# Patient Record
Sex: Male | Born: 1960 | Race: White | Hispanic: No | Marital: Married | Smoking: Never smoker
Health system: Southern US, Community
[De-identification: ages and names within clinical notes are randomized; demographics above are authoritative.]

## PROBLEM LIST (undated history)

## (undated) DIAGNOSIS — A071 Giardiasis [lambliasis]: Secondary | ICD-10-CM

## (undated) DIAGNOSIS — F419 Anxiety disorder, unspecified: Secondary | ICD-10-CM

## (undated) DIAGNOSIS — M5136 Other intervertebral disc degeneration, lumbar region: Secondary | ICD-10-CM

## (undated) DIAGNOSIS — F329 Major depressive disorder, single episode, unspecified: Secondary | ICD-10-CM

## (undated) DIAGNOSIS — K219 Gastro-esophageal reflux disease without esophagitis: Secondary | ICD-10-CM

## (undated) DIAGNOSIS — R740 Nonspecific elevation of levels of transaminase and lactic acid dehydrogenase [LDH]: Secondary | ICD-10-CM

## (undated) DIAGNOSIS — M51369 Other intervertebral disc degeneration, lumbar region without mention of lumbar back pain or lower extremity pain: Secondary | ICD-10-CM

## (undated) DIAGNOSIS — E538 Deficiency of other specified B group vitamins: Secondary | ICD-10-CM

## (undated) DIAGNOSIS — I10 Essential (primary) hypertension: Secondary | ICD-10-CM

## (undated) DIAGNOSIS — J309 Allergic rhinitis, unspecified: Secondary | ICD-10-CM

## (undated) DIAGNOSIS — K649 Unspecified hemorrhoids: Secondary | ICD-10-CM

## (undated) DIAGNOSIS — R911 Solitary pulmonary nodule: Secondary | ICD-10-CM

## (undated) DIAGNOSIS — N200 Calculus of kidney: Secondary | ICD-10-CM

## (undated) DIAGNOSIS — R7401 Elevation of levels of liver transaminase levels: Secondary | ICD-10-CM

## (undated) DIAGNOSIS — E119 Type 2 diabetes mellitus without complications: Secondary | ICD-10-CM

## (undated) DIAGNOSIS — F32A Depression, unspecified: Secondary | ICD-10-CM

## (undated) HISTORY — PX: BACK SURGERY: SHX140

## (undated) HISTORY — PX: KIDNEY STONE SURGERY: SHX686

## (undated) HISTORY — PX: COLONOSCOPY: SHX174

## (undated) HISTORY — PX: ESOPHAGOGASTRODUODENOSCOPY: SHX1529

---

## 2007-09-27 ENCOUNTER — Ambulatory Visit: Payer: Self-pay | Admitting: Internal Medicine

## 2007-11-06 ENCOUNTER — Ambulatory Visit: Payer: Self-pay | Admitting: Internal Medicine

## 2008-02-13 ENCOUNTER — Ambulatory Visit: Payer: Self-pay | Admitting: Internal Medicine

## 2008-08-07 ENCOUNTER — Ambulatory Visit: Payer: Self-pay | Admitting: Internal Medicine

## 2008-10-24 ENCOUNTER — Ambulatory Visit: Payer: Self-pay | Admitting: Internal Medicine

## 2009-01-02 ENCOUNTER — Ambulatory Visit: Payer: Self-pay | Admitting: Gastroenterology

## 2009-09-30 ENCOUNTER — Ambulatory Visit: Payer: Self-pay | Admitting: Internal Medicine

## 2010-05-22 ENCOUNTER — Ambulatory Visit: Payer: Self-pay | Admitting: Gastroenterology

## 2011-06-01 ENCOUNTER — Ambulatory Visit: Payer: Self-pay | Admitting: Gastroenterology

## 2011-06-03 ENCOUNTER — Ambulatory Visit: Payer: Self-pay | Admitting: Gastroenterology

## 2012-03-13 ENCOUNTER — Ambulatory Visit: Payer: Self-pay | Admitting: Family Medicine

## 2012-04-03 ENCOUNTER — Other Ambulatory Visit: Payer: Self-pay | Admitting: Neurosurgery

## 2012-04-05 ENCOUNTER — Encounter (HOSPITAL_COMMUNITY): Payer: Self-pay | Admitting: Pharmacy Technician

## 2012-04-07 ENCOUNTER — Encounter (HOSPITAL_COMMUNITY)
Admission: RE | Admit: 2012-04-07 | Discharge: 2012-04-07 | Disposition: A | Discharge status: Home / Self Care | Payer: BC Managed Care – PPO | Source: Ambulatory Visit | Attending: Neurosurgery | Admitting: Neurosurgery

## 2012-04-07 ENCOUNTER — Encounter (HOSPITAL_COMMUNITY)
Admission: RE | Admit: 2012-04-07 | Discharge: 2012-04-07 | Disposition: A | Discharge status: Home / Self Care | Payer: BC Managed Care – PPO | Source: Ambulatory Visit | Attending: Anesthesiology | Admitting: Anesthesiology

## 2012-04-07 ENCOUNTER — Encounter (HOSPITAL_COMMUNITY): Payer: Self-pay

## 2012-04-07 HISTORY — DX: Type 2 diabetes mellitus without complications: E11.9

## 2012-04-07 HISTORY — DX: Essential (primary) hypertension: I10

## 2012-04-07 HISTORY — DX: Calculus of kidney: N20.0

## 2012-04-07 LAB — CBC
MCHC: 36.2 g/dL — ABNORMAL HIGH (ref 30.0–36.0)
RDW: 12.4 % (ref 11.5–15.5)
WBC: 8.3 10*3/uL (ref 4.0–10.5)

## 2012-04-07 LAB — BASIC METABOLIC PANEL
BUN: 12 mg/dL (ref 6–23)
Creatinine, Ser: 0.79 mg/dL (ref 0.50–1.35)
GFR calc Af Amer: >90 mL/min (ref >90)
GFR calc non Af Amer: >90 mL/min (ref >90)
Potassium: 4.2 mEq/L (ref 3.5–5.1)

## 2012-04-07 LAB — SURGICAL PCR SCREEN
MRSA, PCR: NEGATIVE
Staphylococcus aureus: POSITIVE — AB

## 2012-04-07 NOTE — Pre-Procedure Instructions (Signed)
Wesley Stevens  04/07/2012   Your procedure is scheduled on:  Tuesday, January 16th  Report to [facility] at  2:50 PM.  Call this number if you have problems the morning of surgery: [phone]   Remember:   Do not eat food or drink liquids after midnight.    Take these medicines the morning of surgery with A SIP OF WATER: Norvasc, toprol, oxycodone if needed     STOP Ibuprofen today  Do not wear jewelry, make-up or nail polish.  Do not wear lotions, powders, or perfumes. You may wear deodorant.  Do not shave 48 hours prior to surgery. Men may shave face and neck.  Do not bring valuables to the hospital.  Contacts, dentures or bridgework may not be worn into surgery.  Leave suitcase in the car. After surgery it may be brought to your room.  For patients admitted to the hospital, checkout time is 11:00 AM the day of discharge.   Patients discharged the day of surgery will not be allowed to drive home.    Special Instructions: Shower using CHG 2 nights before surgery and the night before surgery.  If you shower the day of surgery use CHG.  Use special wash - you have one bottle of CHG for all showers.  You should use approximately 1/3 of the bottle for each shower.   Please read over the following fact sheets that you were given: Pain Booklet, Coughing and Deep Breathing, MRSA Information and Surgical Site Infection Prevention

## 2012-04-12 MED ORDER — DEXAMETHASONE SODIUM PHOSPHATE 10 MG/ML IJ SOLN: 10.0000 mg | INTRAMUSCULAR | Status: DC

## 2012-04-12 MED ORDER — CEFAZOLIN SODIUM-DEXTROSE 2-3 GM-% IV SOLR
2.0000 g | INTRAVENOUS | Status: AC
  Administered 2012-04-13: 2 g via INTRAVENOUS

## 2012-04-13 ENCOUNTER — Ambulatory Visit (HOSPITAL_COMMUNITY)
Admission: RE | Admit: 2012-04-13 | Discharge: 2012-04-14 | Disposition: A | Discharge status: Home / Self Care | Payer: BC Managed Care – PPO | Source: Ambulatory Visit | Attending: Neurosurgery | Admitting: Neurosurgery

## 2012-04-13 ENCOUNTER — Ambulatory Visit (HOSPITAL_COMMUNITY): Payer: BC Managed Care – PPO

## 2012-04-13 ENCOUNTER — Encounter (HOSPITAL_COMMUNITY)
Admission: RE | Disposition: A | Discharge status: Home / Self Care | Payer: Self-pay | Source: Ambulatory Visit | Attending: Neurosurgery

## 2012-04-13 ENCOUNTER — Ambulatory Visit (HOSPITAL_COMMUNITY): Payer: BC Managed Care – PPO | Admitting: Anesthesiology

## 2012-04-13 ENCOUNTER — Encounter (HOSPITAL_COMMUNITY): Payer: Self-pay | Admitting: Anesthesiology

## 2012-04-13 DIAGNOSIS — I10 Essential (primary) hypertension: Secondary | ICD-10-CM | POA: Insufficient documentation

## 2012-04-13 DIAGNOSIS — Z01810 Encounter for preprocedural cardiovascular examination: Secondary | ICD-10-CM | POA: Insufficient documentation

## 2012-04-13 DIAGNOSIS — Z79899 Other long term (current) drug therapy: Secondary | ICD-10-CM | POA: Insufficient documentation

## 2012-04-13 DIAGNOSIS — Z01812 Encounter for preprocedural laboratory examination: Secondary | ICD-10-CM | POA: Insufficient documentation

## 2012-04-13 DIAGNOSIS — N289 Disorder of kidney and ureter, unspecified: Secondary | ICD-10-CM | POA: Insufficient documentation

## 2012-04-13 DIAGNOSIS — M5126 Other intervertebral disc displacement, lumbar region: Secondary | ICD-10-CM | POA: Insufficient documentation

## 2012-04-13 DIAGNOSIS — Z01818 Encounter for other preprocedural examination: Secondary | ICD-10-CM | POA: Insufficient documentation

## 2012-04-13 DIAGNOSIS — E119 Type 2 diabetes mellitus without complications: Secondary | ICD-10-CM | POA: Insufficient documentation

## 2012-04-13 DIAGNOSIS — M5116 Intervertebral disc disorders with radiculopathy, lumbar region: Secondary | ICD-10-CM

## 2012-04-13 HISTORY — PX: LUMBAR LAMINECTOMY/DECOMPRESSION MICRODISCECTOMY: SHX5026

## 2012-04-13 LAB — GLUCOSE, CAPILLARY
Glucose-Capillary: 211 mg/dL — ABNORMAL HIGH (ref 70–99)
Glucose-Capillary: 523 mg/dL — ABNORMAL HIGH (ref 70–99)

## 2012-04-13 LAB — GLUCOSE, RANDOM: Glucose, Bld: 491 mg/dL — ABNORMAL HIGH (ref 70–99)

## 2012-04-13 SURGERY — LUMBAR LAMINECTOMY/DECOMPRESSION MICRODISCECTOMY 1 LEVEL
Anesthesia: General | Site: Back | Laterality: Right | Wound class: Clean

## 2012-04-13 MED ORDER — CEFAZOLIN SODIUM-DEXTROSE 2-3 GM-% IV SOLR
INTRAVENOUS | Status: AC
  Filled 2012-04-13: qty 50

## 2012-04-13 MED ORDER — OXYCODONE-ACETAMINOPHEN 5-325 MG PO TABS: 1.0000 | ORAL_TABLET | ORAL | Status: DC | PRN

## 2012-04-13 MED ORDER — SODIUM CHLORIDE 0.9 % IJ SOLN: 3.0000 mL | INTRAMUSCULAR | Status: DC | PRN

## 2012-04-13 MED ORDER — 0.9 % SODIUM CHLORIDE (POUR BTL) OPTIME
TOPICAL | Status: DC | PRN
  Administered 2012-04-13: 1000 mL

## 2012-04-13 MED ORDER — SODIUM CHLORIDE 0.9 % IV SOLN
INTRAVENOUS | Status: DC | PRN
  Administered 2012-04-13: 16:00:00 via INTRAVENOUS

## 2012-04-13 MED ORDER — METFORMIN HCL 500 MG PO TABS
1000.0000 mg | ORAL_TABLET | Freq: Two times a day (BID) | ORAL | Status: DC
  Administered 2012-04-14: 1000 mg via ORAL
  Filled 2012-04-13 (×4): qty 2

## 2012-04-13 MED ORDER — SODIUM CHLORIDE 0.9 % IJ SOLN: 3.0000 mL | Freq: Two times a day (BID) | INTRAMUSCULAR | Status: DC

## 2012-04-13 MED ORDER — METOPROLOL SUCCINATE ER 50 MG PO TB24
50.0000 mg | ORAL_TABLET | Freq: Every day | ORAL | Status: DC
  Filled 2012-04-13: qty 1

## 2012-04-13 MED ORDER — MENTHOL 3 MG MT LOZG: 1.0000 | LOZENGE | OROMUCOSAL | Status: DC | PRN

## 2012-04-13 MED ORDER — INSULIN ASPART 100 UNIT/ML ~~LOC~~ SOLN
0.0000 [IU] | SUBCUTANEOUS | Status: DC
  Administered 2012-04-13: 15 [IU] via SUBCUTANEOUS
  Administered 2012-04-14 (×2): 5 [IU] via SUBCUTANEOUS

## 2012-04-13 MED ORDER — GLIMEPIRIDE 4 MG PO TABS
4.0000 mg | ORAL_TABLET | Freq: Every day | ORAL | Status: DC
Start: 2012-04-14 — End: 2012-04-14
  Administered 2012-04-14: 4 mg via ORAL
  Filled 2012-04-13 (×2): qty 1

## 2012-04-13 MED ORDER — KETOROLAC TROMETHAMINE 30 MG/ML IJ SOLN
30.0000 mg | Freq: Four times a day (QID) | INTRAMUSCULAR | Status: DC
  Administered 2012-04-13 – 2012-04-14 (×2): 30 mg via INTRAVENOUS
  Filled 2012-04-13 (×6): qty 1

## 2012-04-13 MED ORDER — KETOROLAC TROMETHAMINE 30 MG/ML IJ SOLN
INTRAMUSCULAR | Status: DC | PRN
  Administered 2012-04-13: 30 mg via INTRAVENOUS

## 2012-04-13 MED ORDER — ONDANSETRON HCL 4 MG/2ML IJ SOLN
INTRAMUSCULAR | Status: DC | PRN
  Administered 2012-04-13: 4 mg via INTRAVENOUS

## 2012-04-13 MED ORDER — METOCLOPRAMIDE HCL 5 MG/ML IJ SOLN: 10.0000 mg | Freq: Once | INTRAMUSCULAR | Status: DC | PRN

## 2012-04-13 MED ORDER — BUPIVACAINE HCL (PF) 0.5 % IJ SOLN
INTRAMUSCULAR | Status: DC | PRN
  Administered 2012-04-13: 20 mL

## 2012-04-13 MED ORDER — ONDANSETRON HCL 4 MG/2ML IJ SOLN: 4.0000 mg | INTRAMUSCULAR | Status: DC | PRN

## 2012-04-13 MED ORDER — FENTANYL CITRATE 0.05 MG/ML IJ SOLN
INTRAMUSCULAR | Status: DC | PRN
  Administered 2012-04-13: 100 ug via INTRAVENOUS
  Administered 2012-04-13 (×5): 50 ug via INTRAVENOUS

## 2012-04-13 MED ORDER — LACTATED RINGERS IV SOLN
INTRAVENOUS | Status: DC | PRN
  Administered 2012-04-13: 15:00:00 via INTRAVENOUS

## 2012-04-13 MED ORDER — ACETAMINOPHEN 650 MG RE SUPP: 650.0000 mg | RECTAL | Status: DC | PRN

## 2012-04-13 MED ORDER — DEXAMETHASONE SODIUM PHOSPHATE 4 MG/ML IJ SOLN
INTRAMUSCULAR | Status: DC | PRN
  Administered 2012-04-13: 10 mg via INTRAVENOUS

## 2012-04-13 MED ORDER — NEOSTIGMINE METHYLSULFATE 1 MG/ML IJ SOLN
INTRAMUSCULAR | Status: DC | PRN
  Administered 2012-04-13: 4 mg via INTRAVENOUS

## 2012-04-13 MED ORDER — HYDROMORPHONE HCL PF 1 MG/ML IJ SOLN: 0.2500 mg | INTRAMUSCULAR | Status: DC | PRN

## 2012-04-13 MED ORDER — POTASSIUM CHLORIDE IN NACL 20-0.45 MEQ/L-% IV SOLN
INTRAVENOUS | Status: DC
  Administered 2012-04-13: 21:00:00 via INTRAVENOUS
  Filled 2012-04-13 (×3): qty 1000

## 2012-04-13 MED ORDER — PROPOFOL 10 MG/ML IV BOLUS
INTRAVENOUS | Status: DC | PRN
  Administered 2012-04-13: 200 mg via INTRAVENOUS

## 2012-04-13 MED ORDER — SODIUM CHLORIDE 0.9 % IV SOLN
INTRAVENOUS | Status: AC
  Filled 2012-04-13: qty 500

## 2012-04-13 MED ORDER — ACETAMINOPHEN 10 MG/ML IV SOLN
INTRAVENOUS | Status: AC
  Filled 2012-04-13: qty 100

## 2012-04-13 MED ORDER — GLYCOPYRROLATE 0.2 MG/ML IJ SOLN
INTRAMUSCULAR | Status: DC | PRN
  Administered 2012-04-13: 0.6 mg via INTRAVENOUS

## 2012-04-13 MED ORDER — HEMOSTATIC AGENTS (NO CHARGE) OPTIME
TOPICAL | Status: DC | PRN
  Administered 2012-04-13: 1 via TOPICAL

## 2012-04-13 MED ORDER — SODIUM CHLORIDE 0.9 % IR SOLN
Status: DC | PRN
  Administered 2012-04-13: 16:00:00

## 2012-04-13 MED ORDER — MIDAZOLAM HCL 5 MG/5ML IJ SOLN
INTRAMUSCULAR | Status: DC | PRN
  Administered 2012-04-13: 2 mg via INTRAVENOUS

## 2012-04-13 MED ORDER — BACITRACIN 50000 UNITS IM SOLR
INTRAMUSCULAR | Status: AC
  Filled 2012-04-13: qty 1

## 2012-04-13 MED ORDER — OXYCODONE HCL 5 MG PO TABS: 5.0000 mg | ORAL_TABLET | Freq: Once | ORAL | Status: DC | PRN

## 2012-04-13 MED ORDER — ROCURONIUM BROMIDE 100 MG/10ML IV SOLN
INTRAVENOUS | Status: DC | PRN
  Administered 2012-04-13: 50 mg via INTRAVENOUS
  Administered 2012-04-13: 10 mg via INTRAVENOUS

## 2012-04-13 MED ORDER — HYDROMORPHONE HCL PF 1 MG/ML IJ SOLN: 1.0000 mg | INTRAMUSCULAR | Status: DC | PRN

## 2012-04-13 MED ORDER — PHENOL 1.4 % MT LIQD: 1.0000 | OROMUCOSAL | Status: DC | PRN

## 2012-04-13 MED ORDER — AMLODIPINE BESYLATE 10 MG PO TABS
10.0000 mg | ORAL_TABLET | Freq: Every day | ORAL | Status: DC
  Filled 2012-04-13: qty 1

## 2012-04-13 MED ORDER — LIDOCAINE HCL (CARDIAC) 20 MG/ML IV SOLN
INTRAVENOUS | Status: DC | PRN
  Administered 2012-04-13: 50 mg via INTRAVENOUS

## 2012-04-13 MED ORDER — HYDROCODONE-ACETAMINOPHEN 5-325 MG PO TABS
1.0000 | ORAL_TABLET | ORAL | Status: DC | PRN
Start: 2012-04-13 — End: 2012-04-14
  Administered 2012-04-13 – 2012-04-14 (×2): 2 via ORAL
  Filled 2012-04-13 (×2): qty 2

## 2012-04-13 MED ORDER — ACETAMINOPHEN 10 MG/ML IV SOLN
INTRAVENOUS | Status: DC | PRN
  Administered 2012-04-13: 1000 mg via INTRAVENOUS

## 2012-04-13 MED ORDER — CEFAZOLIN SODIUM-DEXTROSE 2-3 GM-% IV SOLR
2.0000 g | Freq: Three times a day (TID) | INTRAVENOUS | Status: DC
  Administered 2012-04-13 – 2012-04-14 (×2): 2 g via INTRAVENOUS
  Filled 2012-04-13 (×3): qty 50

## 2012-04-13 MED ORDER — OXYCODONE HCL 5 MG/5ML PO SOLN: 5.0000 mg | Freq: Once | ORAL | Status: DC | PRN

## 2012-04-13 MED ORDER — CYCLOBENZAPRINE HCL 10 MG PO TABS
10.0000 mg | ORAL_TABLET | Freq: Three times a day (TID) | ORAL | Status: DC | PRN
  Administered 2012-04-13: 10 mg via ORAL
  Filled 2012-04-13: qty 1

## 2012-04-13 MED ORDER — ACETAMINOPHEN 325 MG PO TABS: 650.0000 mg | ORAL_TABLET | ORAL | Status: DC | PRN

## 2012-04-13 MED ORDER — THROMBIN 5000 UNITS EX SOLR
CUTANEOUS | Status: DC | PRN
  Administered 2012-04-13 (×2): 5000 [IU] via TOPICAL

## 2012-04-13 SURGICAL SUPPLY — 44 items
BAG DECANTER FOR FLEXI CONT (MISCELLANEOUS) ×2 IMPLANT
BENZOIN TINCTURE PRP APPL 2/3 (GAUZE/BANDAGES/DRESSINGS) ×2 IMPLANT
BLADE SURG ROTATE 9660 (MISCELLANEOUS) IMPLANT
BRUSH SCRUB EZ PLAIN DRY (MISCELLANEOUS) ×4 IMPLANT
BUR CUTTER 7.0 ROUND (BURR) ×2 IMPLANT
BUR MATCHSTICK NEURO 3.0 LAGG (BURR) ×2 IMPLANT
CANISTER SUCTION 2500CC (MISCELLANEOUS) ×2 IMPLANT
CLOTH BEACON ORANGE TIMEOUT ST (SAFETY) ×2 IMPLANT
CONT SPEC 4OZ CLIKSEAL STRL BL (MISCELLANEOUS) ×2 IMPLANT
DERMABOND ADVANCED (GAUZE/BANDAGES/DRESSINGS) ×1
DERMABOND ADVANCED .7 DNX12 (GAUZE/BANDAGES/DRESSINGS) ×1 IMPLANT
DRAPE LAPAROTOMY 100X72X124 (DRAPES) ×2 IMPLANT
DRAPE MICROSCOPE ZEISS OPMI (DRAPES) ×2 IMPLANT
DRAPE SURG 17X23 STRL (DRAPES) ×4 IMPLANT
DRESSING TELFA 8X3 (GAUZE/BANDAGES/DRESSINGS) ×2 IMPLANT
ELECT REM PT RETURN 9FT ADLT (ELECTROSURGICAL) ×2
ELECTRODE REM PT RTRN 9FT ADLT (ELECTROSURGICAL) ×1 IMPLANT
GAUZE SPONGE 4X4 16PLY XRAY LF (GAUZE/BANDAGES/DRESSINGS) IMPLANT
GLOVE BIO SURGEON STRL SZ 6.5 (GLOVE) ×6 IMPLANT
GLOVE BIOGEL PI IND STRL 7.0 (GLOVE) ×1 IMPLANT
GLOVE BIOGEL PI INDICATOR 7.0 (GLOVE) ×1
GLOVE ECLIPSE 7.5 STRL STRAW (GLOVE) ×4 IMPLANT
GOWN BRE IMP SLV AUR LG STRL (GOWN DISPOSABLE) ×4 IMPLANT
GOWN BRE IMP SLV AUR XL STRL (GOWN DISPOSABLE) ×2 IMPLANT
GOWN STRL REIN 2XL LVL4 (GOWN DISPOSABLE) IMPLANT
KIT BASIN OR (CUSTOM PROCEDURE TRAY) ×2 IMPLANT
KIT ROOM TURNOVER OR (KITS) ×2 IMPLANT
NEEDLE HYPO 22GX1.5 SAFETY (NEEDLE) ×2 IMPLANT
NEEDLE SPNL 22GX3.5 QUINCKE BK (NEEDLE) ×4 IMPLANT
NS IRRIG 1000ML POUR BTL (IV SOLUTION) ×2 IMPLANT
PACK LAMINECTOMY NEURO (CUSTOM PROCEDURE TRAY) ×2 IMPLANT
PAD ARMBOARD 7.5X6 YLW CONV (MISCELLANEOUS) ×6 IMPLANT
PATTIES SURGICAL .75X.75 (GAUZE/BANDAGES/DRESSINGS) ×2 IMPLANT
RUBBERBAND STERILE (MISCELLANEOUS) ×4 IMPLANT
SPONGE GAUZE 4X4 12PLY (GAUZE/BANDAGES/DRESSINGS) ×2 IMPLANT
SPONGE SURGIFOAM ABS GEL SZ50 (HEMOSTASIS) ×2 IMPLANT
STRIP CLOSURE SKIN 1/2X4 (GAUZE/BANDAGES/DRESSINGS) ×2 IMPLANT
SUT VIC AB 2-0 OS6 18 (SUTURE) ×6 IMPLANT
SUT VIC AB 3-0 CP2 18 (SUTURE) ×2 IMPLANT
SYR 20ML ECCENTRIC (SYRINGE) ×2 IMPLANT
TAPE CLOTH 4X10 WHT NS (GAUZE/BANDAGES/DRESSINGS) ×2 IMPLANT
TOWEL OR 17X24 6PK STRL BLUE (TOWEL DISPOSABLE) ×2 IMPLANT
TOWEL OR 17X26 10 PK STRL BLUE (TOWEL DISPOSABLE) ×2 IMPLANT
WATER STERILE IRR 1000ML POUR (IV SOLUTION) ×2 IMPLANT

## 2012-04-13 NOTE — Transfer of Care (Signed)
Immediate Anesthesia Transfer of Care Note  Patient: Wesley Stevens  Procedure(s) Performed: Procedure(s) (LRB) with comments: LUMBAR LAMINECTOMY/DECOMPRESSION MICRODISCECTOMY 1 LEVEL (Right) - Right Lumbar Four-Five Microdiskectomy  Patient Location: PACU  Anesthesia Type:General  Level of Consciousness: awake, alert  and sedated  Airway & Oxygen Therapy: Patient Spontanous Breathing and Patient connected to face mask oxygen  Post-op Assessment: Report given to PACU RN  Post vital signs: stable  Complications: No apparent anesthesia complications

## 2012-04-13 NOTE — Anesthesia Postprocedure Evaluation (Signed)
  Anesthesia Post-op Note  Patient: Wesley Stevens  Procedure(s) Performed: Procedure(s) (LRB) with comments: LUMBAR LAMINECTOMY/DECOMPRESSION MICRODISCECTOMY 1 LEVEL (Right) - Right Lumbar Four-Five Microdiskectomy  Patient Location: PACU  Anesthesia Type:General  Level of Consciousness: awake and alert   Airway and Oxygen Therapy: Patient Spontanous Breathing  Post-op Pain: mild  Post-op Assessment: Post-op Vital signs reviewed, Patient's Cardiovascular Status Stable, Respiratory Function Stable, Patent Airway and No signs of Nausea or vomiting  Post-op Vital Signs: Reviewed and stable  Complications: No apparent anesthesia complications

## 2012-04-13 NOTE — Preoperative (Addendum)
Beta Blockers   Reason not to administer Beta Blockers:Not Applicable 

## 2012-04-13 NOTE — Anesthesia Preprocedure Evaluation (Addendum)
Anesthesia Evaluation  Patient identified by MRN, date of birth, ID band Patient awake    Reviewed: Allergy & Precautions, H&P , NPO status , Patient's Chart, lab work & pertinent test results, reviewed documented beta blocker date and time   Airway Mallampati: II TM Distance: >3 FB Neck ROM: full    Dental  (+) Teeth Intact and Dental Advisory Given   Pulmonary neg pulmonary ROS,  breath sounds clear to auscultation        Cardiovascular hypertension, On Medications and On Home Beta Blockers Rhythm:regular Rate:Tachycardia     Neuro/Psych negative neurological ROS  negative psych ROS   GI/Hepatic negative GI ROS, Neg liver ROS,   Endo/Other  diabetes, Poorly Controlled, Oral Hypoglycemic Agents  Renal/GU Renal disease  negative genitourinary   Musculoskeletal negative musculoskeletal ROS (+)   Abdominal (+)  Abdomen: soft. Bowel sounds: normal.  Peds  Hematology negative hematology ROS (+)   Anesthesia Other Findings See surgeon's H&P   Reproductive/Obstetrics negative OB ROS                         Anesthesia Physical Anesthesia Plan  ASA: II  Anesthesia Plan: General   Post-op Pain Management:    Induction: Intravenous  Airway Management Planned: Oral ETT  Additional Equipment:   Intra-op Plan:   Post-operative Plan: Extubation in OR  Informed Consent: I have reviewed the patients History and Physical, chart, labs and discussed the procedure including the risks, benefits and alternatives for the proposed anesthesia with the patient or authorized representative who has indicated his/her understanding and acceptance.   Dental Advisory Given  Plan Discussed with: CRNA and Surgeon  Anesthesia Plan Comments:         Anesthesia Quick Evaluation

## 2012-04-13 NOTE — Op Note (Signed)
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 Localizing x-ray was taken prior to incision to identify the appropriate level. Midline incision was made above the spinous processes of L4 and L5. Using Bovie cutting current the incision was carried on the spinous processes. Subperiosteal dissection was then carried out on the right side of the spinous processes and lamina and self-retaining tract was placed for exposure. X-ray showed approach the appropriate level. Using the high-speed drill the inferior 80% of the L4 lamina the medial one half of the facet joint and the superior edge of the L5 lamina were removed. Residual bone and ligamentum flavum removed in a piecemeal fashion. The microscope was draped brought into the field and used for the remainder of the case. Using microdissection technique the lateral aspect of the thecal sac and L5 nerve were identified. The disc space at L4-5 was identified and we dissected superiorly. We saw the large superior fragment disc wedged beneath the L4 nerve root this was removed in a piecemeal fashion. We then able to visualized and decompressed L4 nerve root. We then incised the disc space and thoroughly cleaned out the disc space to decompress the thecal sac and L5 nerve root. At this time inspection was carried out all directions for any evidence of residual compression and none could be identified. Large amounts of irrigation were carried out and any bleeding controlled with upper coagulation and Gelfoam. The was then closed in multiple layers of Vicryl on the muscle fascia subcutaneous and  subcuticular tissues. Dermabond and Steri-Strips were placed on the skin. A sterile dressing was then applied and the patient was extubated and taken to recovery in stable condition.

## 2012-04-13 NOTE — Plan of Care (Signed)
Problem: Consults Goal: Diagnosis - Spinal Surgery Outcome: Completed/Met Date Met:  04/13/12 Microdiscectomy

## 2012-04-13 NOTE — Anesthesia Procedure Notes (Signed)
Procedure Name: Intubation Date/Time: 04/13/2012 3:45 PM Performed by: Ellin Goodie Pre-anesthesia Checklist: Patient identified, Emergency Drugs available, Suction available, Patient being monitored and Timeout performed Patient Re-evaluated:Patient Re-evaluated prior to inductionOxygen Delivery Method: Circle system utilized Preoxygenation: Pre-oxygenation with 100% oxygen Intubation Type: IV induction Ventilation: Mask ventilation without difficulty Laryngoscope Size: Mac and 4 Grade View: Grade I Tube type: Oral Tube size: 7.5 mm Number of attempts: 1 Airway Equipment and Method: Stylet Placement Confirmation: ETT inserted through vocal cords under direct vision,  positive ETCO2 and breath sounds checked- equal and bilateral Secured at: 23 cm Tube secured with: Tape Dental Injury: Teeth and Oropharynx as per pre-operative assessment

## 2012-04-13 NOTE — H&P (Signed)
  Wesley Stevens is an 52 y.o. male.   Chief Complaint: Right leg pain HPI: The patient is a 52 year old gentleman who presented with back and right lower extremity radiating pain. He was tried on aggressive conservative therapy without improvement. He underwent an MRI scan showed a disc herniation at L4-5 on the right. After failing further conservative therapy the patient requested surgery now comes for a right L4-5 microdiscectomy. I've had a long discussion with him regarding the risks and benefits of surgical intervention. The risks discussed include but are not limited to bleeding infection weakness numbness paralysis spinal fluid leakage coma and death. We have discussed alternative methods of therapy along with the risks and benefits of not intervention. He has had the opportunity to ask numerous questions and appears to understand. With this information in hand he has requested we proceed with surgery.  Past Medical History  Diagnosis Date  . Diabetes mellitus without complication   . Hypertension     does not see a cardiologist  . Kidney stones     Past Surgical History  Procedure Date  . Kidney stone surgery     No family history on file. Social History:  reports that he has never smoked. He does not have any smokeless tobacco history on file. He reports that he drinks alcohol. He reports that he does not use illicit drugs.  Allergies:  Allergies  Allergen Reactions  . Lovastatin Other (See Comments)    Severe muscle cramps.  . Oxycodone Itching and Rash    Medications Prior to Admission  Medication Sig Dispense Refill  . amLODipine (NORVASC) 10 MG tablet Take 10 mg by mouth daily.      Marland Kitchen glimepiride (AMARYL) 4 MG tablet Take 4 mg by mouth daily before breakfast.      . ibuprofen (ADVIL,MOTRIN) 200 MG tablet Take 800 mg by mouth every 8 (eight) hours as needed. Pain.      . metFORMIN (GLUCOPHAGE) 1000 MG tablet Take 1,000 mg by mouth 2 (two) times daily with a meal.        . metoprolol succinate (TOPROL-XL) 50 MG 24 hr tablet Take 50 mg by mouth daily. Take with or immediately following a meal.      . oxyCODONE (ROXICODONE) 15 MG immediate release tablet Take 15 mg by mouth every 4 (four) hours as needed. pain        Results for orders placed during the hospital encounter of 04/13/12 (from the past 48 hour(s))  GLUCOSE, CAPILLARY     Status: Abnormal   Collection Time   04/13/12 12:39 PM      Component Value Range Comment   Glucose-Capillary 211 (*) 70 - 99 mg/dL    No results found.  Review of systems not obtained due to patient factors.  Blood pressure 116/83, pulse 99, temperature 97.8 F (36.6 C), temperature source Oral, resp. rate 20, SpO2 97.00%.  The patient is awake alert and oriented. He has no facial asymmetry. His gait is nonantalgic. Reflexes are 1-2+ and equal. He has decreased strength of dorsiflexion on the right. Assessment/Plan \\Impression  is that of a herniated disc at L4-5 on the right. The plan is for a right L4-5 microdiscectomy.  Wesley Meeker, MD 04/13/2012, 3:20 PM

## 2012-04-14 ENCOUNTER — Encounter (HOSPITAL_COMMUNITY): Payer: Self-pay | Admitting: Neurosurgery

## 2012-04-14 LAB — GLUCOSE, CAPILLARY: Glucose-Capillary: 222 mg/dL — ABNORMAL HIGH (ref 70–99)

## 2012-04-14 NOTE — Progress Notes (Signed)
PT. CBG AT 2149 WAS 514 AND RECHECK ON UNIT AT 2153 WAS 523.  STAT GLUCOSE CHECK WAS ORDERED AND THE RESULTS WERE 491.  DR. [facility] WAS PAGED AND NEW ORDERS WERE RECEIVED TO BEGIN MODERATE S/S INSULIN EVERY 4 HOURS.                   CHRIS Hollyn Stucky RN

## 2012-04-14 NOTE — Discharge Summary (Signed)
Physician Discharge Summary  Patient ID: Wesley Stevens MRN: [id number] DOB/AGE: June 28, 1960 52 y.o.  Admit date: 04/13/2012 Discharge date: 04/14/2012  Admission Diagnoses:  Discharge Diagnoses:  Active Problems:  * No active hospital problems. *    Discharged Condition: good  Hospital Course: Surgery one day, home the next. Did well. Pain markedly improved. Ambulated well. Home POD 1, specific instructions given.  Consults: None  Significant Diagnostic Studies: none  Treatments: surgery: L 45 microdiascectomy  Discharge Exam: Blood pressure 101/65, pulse 93, temperature 97.8 F (36.6 C), temperature source Oral, resp. rate 16, SpO2 95.00%. Incision/Wound:healing well  Disposition: Final discharge disposition not confirmed  Discharge Orders    Future Orders Please Complete By Expires   Diet general      Discharge instructions      Comments:   Mostly bedrest. Get up 9 or 10 times each day and walk for 15-20 minutes each time. Very little sitting the first week. No riding in the car until your first post op appointment. If you had neck surgery...may shower from the chest down. If you had low back surgery....you may shower with a saran wrap covering over the incision. Take your pain medicine as needed...and other medicines that you are instructed to take. Call for an appointment...[phone].   Call MD for:  temperature >100.4      Call MD for:  persistant nausea and vomiting      Call MD for:  severe uncontrolled pain      Call MD for:  redness, tenderness, or signs of infection (pain, swelling, redness, odor or green/yellow discharge around incision site)      Call MD for:  difficulty breathing, headache or visual disturbances      Call MD for:  hives          Medication List     As of 04/14/2012  7:55 AM    TAKE these medications         amLODipine 10 MG tablet   Commonly known as: NORVASC   Take 10 mg by mouth daily.      glimepiride 4 MG tablet   Commonly  known as: AMARYL   Take 4 mg by mouth daily before breakfast.      ibuprofen 200 MG tablet   Commonly known as: ADVIL,MOTRIN   Take 800 mg by mouth every 8 (eight) hours as needed. Pain.      metFORMIN 1000 MG tablet   Commonly known as: GLUCOPHAGE   Take 1,000 mg by mouth 2 (two) times daily with a meal.      metoprolol succinate 50 MG 24 hr tablet   Commonly known as: TOPROL-XL   Take 50 mg by mouth daily. Take with or immediately following a meal.      oxyCODONE 15 MG immediate release tablet   Commonly known as: ROXICODONE   Take 15 mg by mouth every 4 (four) hours as needed. pain         At home rest most of the time. Get up 9 or 10 times each day and take a 15 or 20 minute walk. No riding in the car and to your first postoperative appointment. If you have neck surgery you may shower from the chest down starting on the third postoperative day. If you had back surgery he may start showering on the third postoperative day with saran wrap wrapped around your incisional area 3 times. After the shower remove the saran wrap. Take pain medicine as needed and  other medications as instructed. Call my office for an appointment.  SignedReinaldo Meeker, MD 04/14/2012, 7:55 AM

## 2013-06-28 IMAGING — CR DG CHEST 2V
2 series · 2 of 2 positions shown · non-contrast
Comparison: None.

CLINICAL DATA: Hypertension; preoperative lumbar discectomy

CHEST - 2 VIEW

[view not recorded (1 of 2)]
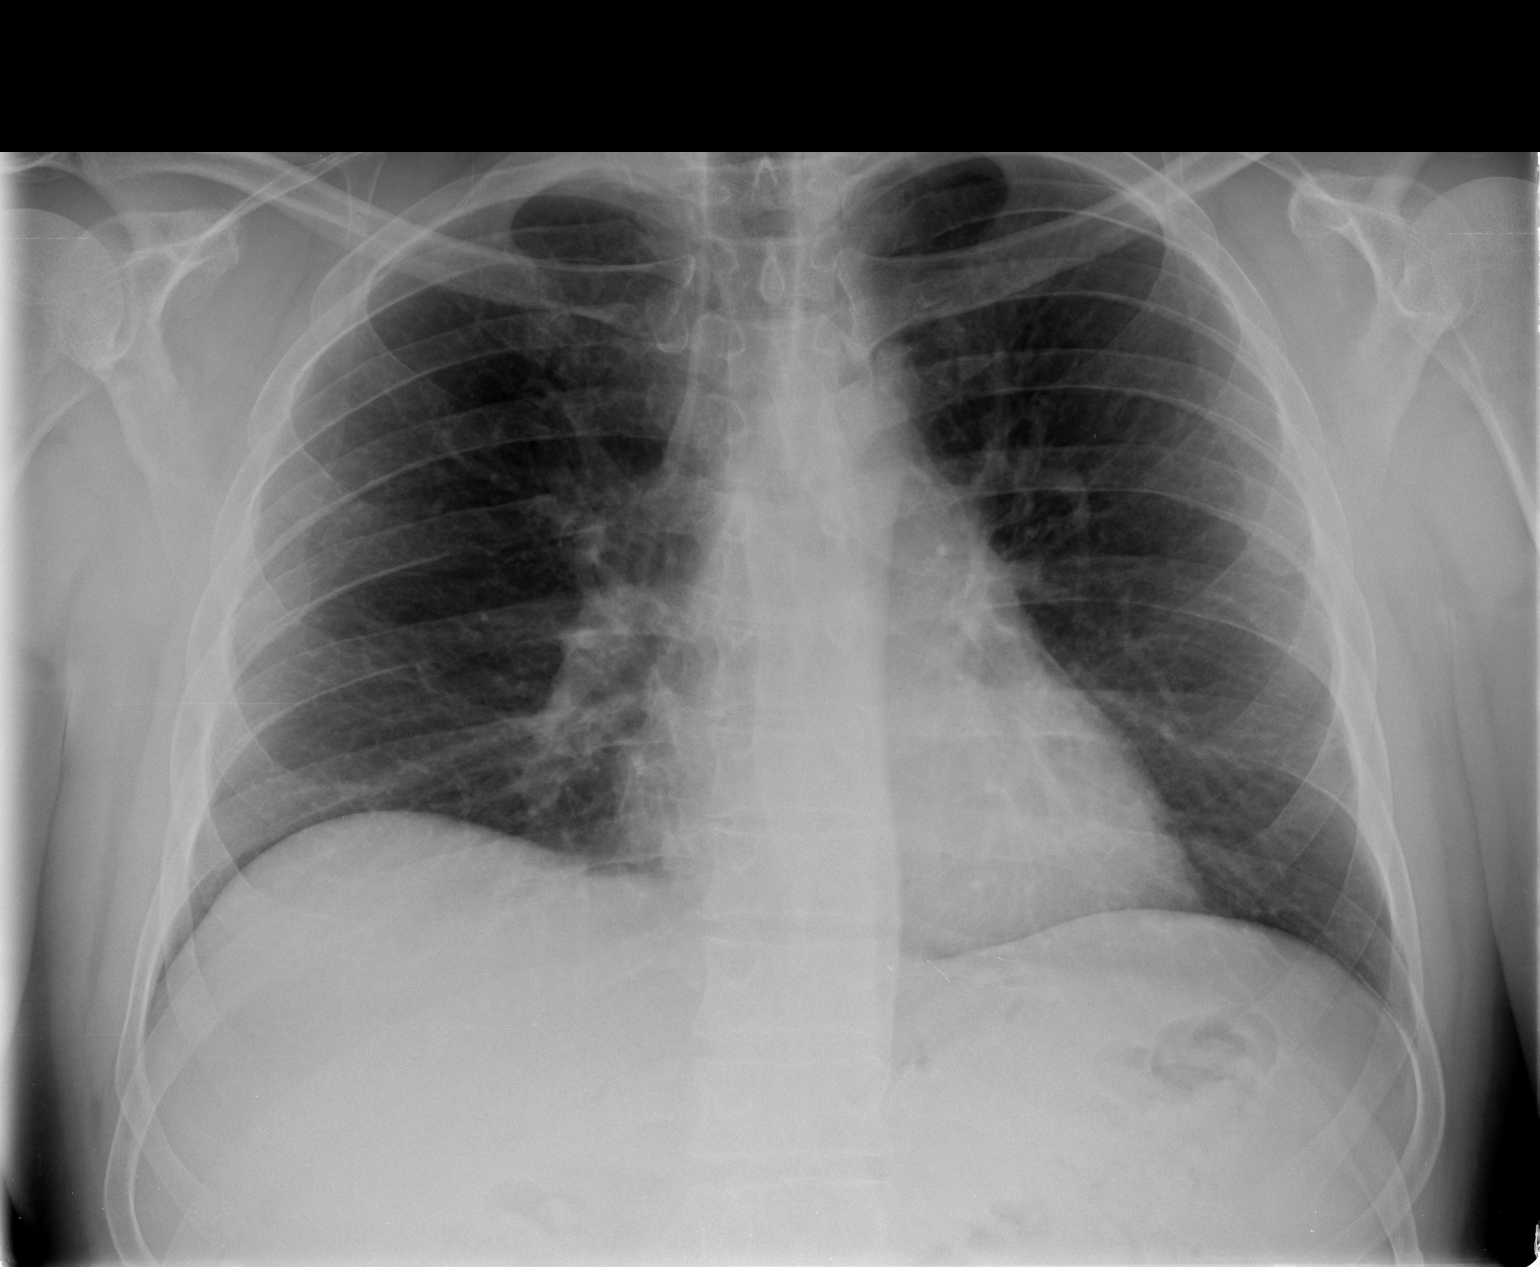

[view not recorded (2 of 2)]
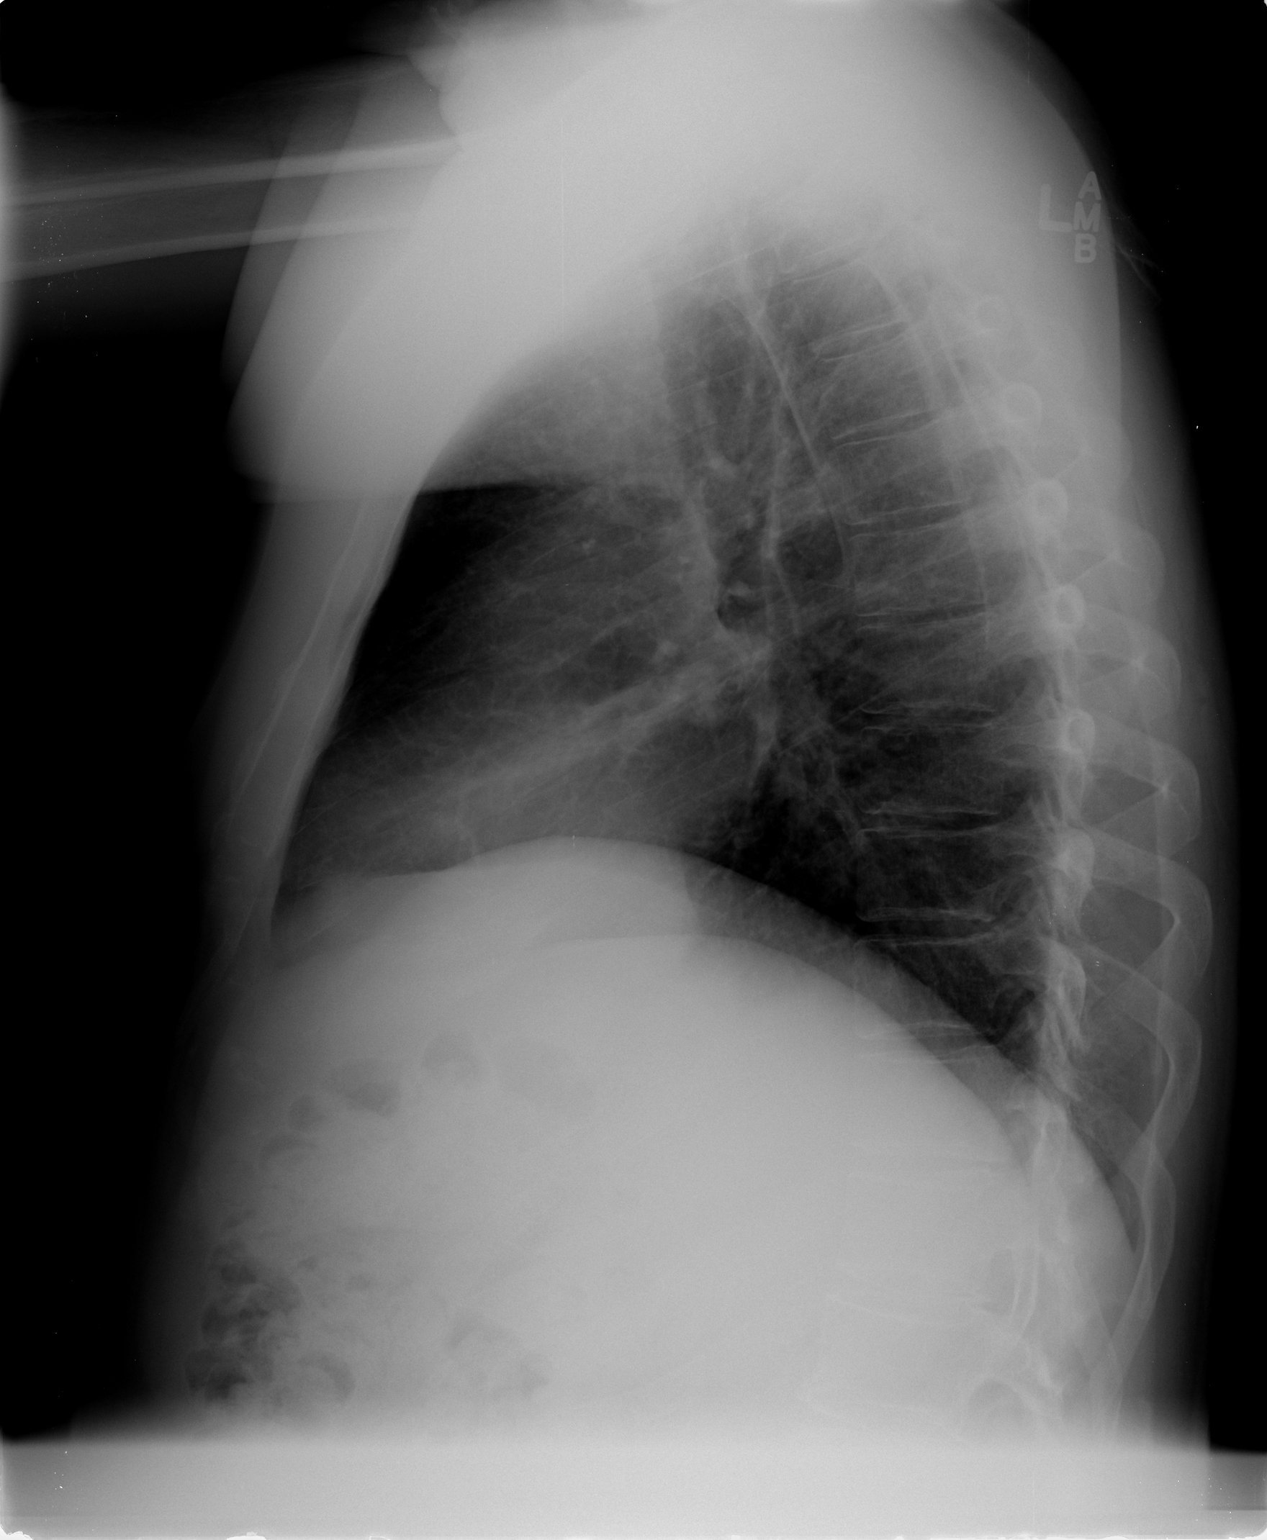

[2 of 2 positions shown; findings below may reference images not displayed]

FINDINGS: Lungs clear.  The heart size and pulmonary vascularity
are normal.  No adenopathy.  No bone lesions.
IMPRESSION: No abnormality noted.

## 2013-10-16 ENCOUNTER — Ambulatory Visit: Payer: Self-pay | Admitting: Internal Medicine

## 2014-11-21 DIAGNOSIS — E1165 Type 2 diabetes mellitus with hyperglycemia: Secondary | ICD-10-CM | POA: Insufficient documentation

## 2014-11-21 DIAGNOSIS — E538 Deficiency of other specified B group vitamins: Secondary | ICD-10-CM | POA: Insufficient documentation

## 2016-02-27 ENCOUNTER — Ambulatory Visit: Payer: Self-pay | Admitting: Dietician

## 2016-05-12 ENCOUNTER — Other Ambulatory Visit: Payer: Self-pay | Admitting: Internal Medicine

## 2016-05-12 DIAGNOSIS — K219 Gastro-esophageal reflux disease without esophagitis: Secondary | ICD-10-CM

## 2016-05-19 ENCOUNTER — Ambulatory Visit
Admission: RE | Admit: 2016-05-19 | Discharge: 2016-05-19 | Disposition: A | Discharge status: Home / Self Care | Payer: BLUE CROSS/BLUE SHIELD | Source: Ambulatory Visit | Attending: Internal Medicine | Admitting: Internal Medicine

## 2016-05-19 DIAGNOSIS — K219 Gastro-esophageal reflux disease without esophagitis: Secondary | ICD-10-CM | POA: Diagnosis not present

## 2016-05-19 DIAGNOSIS — K449 Diaphragmatic hernia without obstruction or gangrene: Secondary | ICD-10-CM | POA: Insufficient documentation

## 2016-06-15 ENCOUNTER — Other Ambulatory Visit: Payer: Self-pay | Admitting: Gastroenterology

## 2016-06-15 DIAGNOSIS — R1013 Epigastric pain: Secondary | ICD-10-CM

## 2016-06-29 ENCOUNTER — Encounter
Admission: RE | Admit: 2016-06-29 | Discharge: 2016-06-29 | Disposition: A | Discharge status: Home / Self Care | Payer: BLUE CROSS/BLUE SHIELD | Source: Ambulatory Visit | Attending: Gastroenterology | Admitting: Gastroenterology

## 2016-06-29 ENCOUNTER — Ambulatory Visit
Admission: RE | Admit: 2016-06-29 | Discharge: 2016-06-29 | Disposition: A | Discharge status: Home / Self Care | Payer: BLUE CROSS/BLUE SHIELD | Source: Ambulatory Visit | Attending: Gastroenterology | Admitting: Gastroenterology

## 2016-06-29 DIAGNOSIS — K7689 Other specified diseases of liver: Secondary | ICD-10-CM

## 2016-06-29 DIAGNOSIS — K824 Cholesterolosis of gallbladder: Secondary | ICD-10-CM | POA: Insufficient documentation

## 2016-06-29 DIAGNOSIS — R1013 Epigastric pain: Secondary | ICD-10-CM

## 2016-06-29 MED ORDER — TECHNETIUM TC 99M MEBROFENIN IV KIT
5.0000 | PACK | Freq: Once | INTRAVENOUS | Status: AC | PRN
  Administered 2016-06-29: 5.41 via INTRAVENOUS

## 2016-08-09 ENCOUNTER — Encounter: Payer: Self-pay | Admitting: *Deleted

## 2016-08-10 ENCOUNTER — Other Ambulatory Visit
Admission: RE | Admit: 2016-08-10 | Discharge: 2016-08-10 | Disposition: A | Discharge status: Home / Self Care | Payer: BLUE CROSS/BLUE SHIELD | Source: Ambulatory Visit | Attending: Gastroenterology | Admitting: Gastroenterology

## 2016-08-10 ENCOUNTER — Ambulatory Visit: Payer: BLUE CROSS/BLUE SHIELD | Admitting: Anesthesiology

## 2016-08-10 ENCOUNTER — Ambulatory Visit
Admission: RE | Admit: 2016-08-10 | Discharge: 2016-08-10 | Disposition: A | Discharge status: Home / Self Care | Payer: BLUE CROSS/BLUE SHIELD | Source: Ambulatory Visit | Attending: Gastroenterology | Admitting: Gastroenterology

## 2016-08-10 ENCOUNTER — Encounter
Admission: RE | Disposition: A | Discharge status: Home / Self Care | Payer: Self-pay | Source: Ambulatory Visit | Attending: Gastroenterology

## 2016-08-10 DIAGNOSIS — E538 Deficiency of other specified B group vitamins: Secondary | ICD-10-CM | POA: Diagnosis not present

## 2016-08-10 DIAGNOSIS — I10 Essential (primary) hypertension: Secondary | ICD-10-CM | POA: Diagnosis not present

## 2016-08-10 DIAGNOSIS — K6389 Other specified diseases of intestine: Secondary | ICD-10-CM | POA: Diagnosis not present

## 2016-08-10 DIAGNOSIS — K319 Disease of stomach and duodenum, unspecified: Secondary | ICD-10-CM | POA: Diagnosis not present

## 2016-08-10 DIAGNOSIS — K449 Diaphragmatic hernia without obstruction or gangrene: Secondary | ICD-10-CM | POA: Insufficient documentation

## 2016-08-10 DIAGNOSIS — K210 Gastro-esophageal reflux disease with esophagitis: Secondary | ICD-10-CM | POA: Diagnosis not present

## 2016-08-10 DIAGNOSIS — Z79899 Other long term (current) drug therapy: Secondary | ICD-10-CM | POA: Diagnosis not present

## 2016-08-10 DIAGNOSIS — Z8619 Personal history of other infectious and parasitic diseases: Secondary | ICD-10-CM | POA: Insufficient documentation

## 2016-08-10 DIAGNOSIS — R112 Nausea with vomiting, unspecified: Secondary | ICD-10-CM | POA: Diagnosis not present

## 2016-08-10 DIAGNOSIS — Z794 Long term (current) use of insulin: Secondary | ICD-10-CM | POA: Insufficient documentation

## 2016-08-10 DIAGNOSIS — E119 Type 2 diabetes mellitus without complications: Secondary | ICD-10-CM | POA: Insufficient documentation

## 2016-08-10 DIAGNOSIS — K2950 Unspecified chronic gastritis without bleeding: Secondary | ICD-10-CM | POA: Insufficient documentation

## 2016-08-10 HISTORY — DX: Nonspecific elevation of levels of transaminase and lactic acid dehydrogenase (ldh): R74.0

## 2016-08-10 HISTORY — DX: Depression, unspecified: F32.A

## 2016-08-10 HISTORY — PX: ESOPHAGOGASTRODUODENOSCOPY (EGD) WITH PROPOFOL: SHX5813

## 2016-08-10 HISTORY — DX: Anxiety disorder, unspecified: F41.9

## 2016-08-10 HISTORY — DX: Major depressive disorder, single episode, unspecified: F32.9

## 2016-08-10 HISTORY — DX: Elevation of levels of liver transaminase levels: R74.01

## 2016-08-10 HISTORY — DX: Solitary pulmonary nodule: R91.1

## 2016-08-10 HISTORY — DX: Deficiency of other specified B group vitamins: E53.8

## 2016-08-10 HISTORY — DX: Giardiasis (lambliasis): A07.1

## 2016-08-10 HISTORY — DX: Allergic rhinitis, unspecified: J30.9

## 2016-08-10 HISTORY — DX: Calculus of kidney: N20.0

## 2016-08-10 HISTORY — DX: Gastro-esophageal reflux disease without esophagitis: K21.9

## 2016-08-10 HISTORY — DX: Unspecified hemorrhoids: K64.9

## 2016-08-10 HISTORY — DX: Other intervertebral disc degeneration, lumbar region: M51.36

## 2016-08-10 HISTORY — DX: Other intervertebral disc degeneration, lumbar region without mention of lumbar back pain or lower extremity pain: M51.369

## 2016-08-10 LAB — GLUCOSE, CAPILLARY: GLUCOSE-CAPILLARY: 123 mg/dL — AB (ref 65–99)

## 2016-08-10 SURGERY — ESOPHAGOGASTRODUODENOSCOPY (EGD) WITH PROPOFOL
Anesthesia: General

## 2016-08-10 MED ORDER — PROPOFOL 10 MG/ML IV BOLUS
INTRAVENOUS | Status: DC | PRN
  Administered 2016-08-10: 30 mg via INTRAVENOUS
  Administered 2016-08-10: 50 mg via INTRAVENOUS
  Administered 2016-08-10: 30 mg via INTRAVENOUS

## 2016-08-10 MED ORDER — MIDAZOLAM HCL 2 MG/2ML IJ SOLN
INTRAMUSCULAR | Status: AC
  Filled 2016-08-10: qty 2

## 2016-08-10 MED ORDER — LIDOCAINE HCL 2 % IJ SOLN
INTRAMUSCULAR | Status: AC
  Filled 2016-08-10: qty 10

## 2016-08-10 MED ORDER — LIDOCAINE HCL (PF) 1 % IJ SOLN
INTRAMUSCULAR | Status: AC
  Administered 2016-08-10: 0.3 mL via INTRADERMAL
  Filled 2016-08-10: qty 2

## 2016-08-10 MED ORDER — FENTANYL CITRATE (PF) 100 MCG/2ML IJ SOLN
INTRAMUSCULAR | Status: AC
  Filled 2016-08-10: qty 2

## 2016-08-10 MED ORDER — FENTANYL CITRATE (PF) 100 MCG/2ML IJ SOLN
INTRAMUSCULAR | Status: DC | PRN
  Administered 2016-08-10: 50 ug via INTRAVENOUS

## 2016-08-10 MED ORDER — PROPOFOL 500 MG/50ML IV EMUL
INTRAVENOUS | Status: DC | PRN
  Administered 2016-08-10: 150 ug/kg/min via INTRAVENOUS

## 2016-08-10 MED ORDER — MIDAZOLAM HCL 2 MG/2ML IJ SOLN
INTRAMUSCULAR | Status: DC | PRN
  Administered 2016-08-10: 2 mg via INTRAVENOUS

## 2016-08-10 MED ORDER — LIDOCAINE HCL (PF) 1 % IJ SOLN
2.0000 mL | Freq: Once | INTRAMUSCULAR | Status: AC
  Administered 2016-08-10: 0.3 mL via INTRADERMAL

## 2016-08-10 MED ORDER — SODIUM CHLORIDE 0.9 % IV SOLN: INTRAVENOUS | Status: DC

## 2016-08-10 MED ORDER — LIDOCAINE HCL (CARDIAC) 20 MG/ML IV SOLN
INTRAVENOUS | Status: DC | PRN
Start: 2016-08-10 — End: 2016-08-10
  Administered 2016-08-10: 40 mg via INTRAVENOUS

## 2016-08-10 MED ORDER — SODIUM CHLORIDE 0.9 % IV SOLN
INTRAVENOUS | Status: DC
  Administered 2016-08-10: 09:00:00 via INTRAVENOUS
  Administered 2016-08-10: 1000 mL via INTRAVENOUS

## 2016-08-10 MED ORDER — PROPOFOL 500 MG/50ML IV EMUL
INTRAVENOUS | Status: AC
  Filled 2016-08-10: qty 50

## 2016-08-10 NOTE — H&P (Signed)
Outpatient short stay form Pre-procedure 08/10/2016 9:28 AM Christena Deem MD  Primary Physician: Dr. Daniel Nones  Reason for visit:  EGD  History of present illness:  Patient is a 56 year old male presenting today as above. He has a history of intermittent relatively frequent nausea and vomiting that may occur about 30 minutes after eating. Is no hematemesis. He takes no NSAIDs although he does take 81 mg aspirin. That's been held for several days. He does have a history of a gallbladder polyp. It is of note that he had a Giardia infection 2013. He is insulin-dependent diabetic as well. He does dexilant daily and has tried it twice a day without decrease of symptoms. He says that his reflux symptoms are well handled by the [facility] independently.    Current Facility-Administered Medications:  .  0.9 %  sodium chloride infusion, , Intravenous, Continuous, Christena Deem, MD, Last Rate: 20 mL/hr at 08/10/16 0818, 1,000 mL at 08/10/16 0818 .  0.9 %  sodium chloride infusion, , Intravenous, Continuous, Christena Deem, MD  Prescriptions Prior to Admission  Medication Sig Dispense Refill Last Dose  . Cyanocobalamin (VITAMIN B 12 PO) Take by mouth.     . dexlansoprazole (DEXILANT) 60 MG capsule Take 60 mg by mouth daily.     . empagliflozin (JARDIANCE) 25 MG TABS tablet Take 25 mg by mouth daily.     . fexofenadine (ALLEGRA) 180 MG tablet Take 180 mg by mouth daily.     Marland Kitchen amLODipine (NORVASC) 10 MG tablet Take 10 mg by mouth daily.   04/13/2012 at Unknown  . glimepiride (AMARYL) 4 MG tablet Take 4 mg by mouth daily before breakfast.   04/12/2012 at Unknown  . ibuprofen (ADVIL,MOTRIN) 200 MG tablet Take 800 mg by mouth every 8 (eight) hours as needed. Pain.   04/12/2012 at Unknown  . metFORMIN (GLUCOPHAGE) 1000 MG tablet Take 1,000 mg by mouth 2 (two) times daily with a meal.   04/12/2012 at Unknown  . metoprolol succinate (TOPROL-XL) 50 MG 24 hr tablet Take 50 mg by mouth daily. Take with or  immediately following a meal.   04/13/2012 at Unknown  . oxyCODONE (ROXICODONE) 15 MG immediate release tablet Take 15 mg by mouth every 4 (four) hours as needed. pain   04/12/2012 at Unknown     Allergies  Allergen Reactions  . Ace Inhibitors   . Hydrocodone   . Lovastatin Other (See Comments)    Severe muscle cramps.  . Oxycodone Itching and Rash     Past Medical History:  Diagnosis Date  . Allergic rhinitis   . Anxiety   . DDD (degenerative disc disease), lumbar   . Depression   . Diabetes mellitus without complication (HCC)   . Elevated transaminase level   . GERD (gastroesophageal reflux disease)   . Giardia   . Hemorrhoids   . Hypertension    does not see a cardiologist  . Kidney stones   . Lung nodule   . Renal stones   . Vitamin B 12 deficiency     Review of systems:      Physical Exam    Heart and lungs: Regular rate and rhythm without rub or gallop, lungs are bilaterally clear.    HEENT: Normocephalic atraumatic eyes are anicteric    Other:     Pertinant exam for procedure: Soft nontender nondistended bowel sounds positive normoactive.    Planned proceedures: EGD and indicated procedures.have discussed the risks benefits and complications of procedures  to include not limited to bleeding, infection, perforation and the risk of sedation and the patient wishes to proceed.    Christena DeemMartin U Skulskie, MD Gastroenterology 08/10/2016  9:28 AM

## 2016-08-10 NOTE — Op Note (Signed)
[facility] Gastroenterology Patient Name: Wesley Stevens Procedure Date: 08/10/2016 9:15 AM MRN: [id number] Account #: [id number] Date of Birth: December 03, 1960 Admit Type: Outpatient Age: 56 Room: [facility] ENDO ROOM 3 Gender: Male Note Status: Finalized Procedure:            Upper GI endoscopy Indications:          Dyspepsia, Nausea with vomiting Providers:            Christena Deem, MD Referring MD:         Daniel Nones, MD (Referring MD) Medicines:            Monitored Anesthesia Care Complications:        No immediate complications. Procedure:            Pre-Anesthesia Assessment:                       - ASA Grade Assessment: III - A patient with severe                        systemic disease.                       After obtaining informed consent, the endoscope was                        passed under direct vision. Throughout the procedure,                        the patient's blood pressure, pulse, and oxygen                        saturations were monitored continuously. The Endoscope                        was introduced through the mouth, and advanced to the                        third part of duodenum. The upper GI endoscopy was                        accomplished without difficulty. The patient tolerated                        the procedure well. Findings:      The Z-line was variable. Biopsies were taken with a cold forceps for       histology.      The exam of the esophagus was otherwise normal.      Patchy mildly erythematous mucosa without bleeding was found in the       gastric antrum.      Patchy minimal inflammation characterized by erythema and friability was       found in the gastric body.      Biopsies were taken with a cold forceps in the gastric body and in the       gastric antrum for histology.      The cardia and gastric fundus were normal on retroflexion.      The examined duodenum was normal. Biopsies were taken with a cold   forceps for histology, rule out giardia.      A small hiatal hernia was found. The Z-line was a  variable distance from       incisors; the hiatal hernia was sliding. Impression:           - Z-line variable. Biopsied.                       - Erythematous mucosa in the antrum.                       - Bile gastritis.                       - Normal examined duodenum. Biopsied.                       - Biopsies were taken with a cold forceps for histology                        in the gastric body and in the gastric antrum. Recommendation:       - Discharge patient to home.                       - Await pathology results.                       - Continue present medications.                       - consider trial of carafate                       - Await pathology results.                       - Return to GI clinic in 3 weeks.                       - stool for O+P times 3                       - Soft diet for 2 days, then advance as tolerated Procedure Code(s):    --- Professional ---                       [phone], Esophagogastroduodenoscopy, flexible, transoral;                        with biopsy, single or multiple Diagnosis Code(s):    --- Professional ---                       K22.8, Other specified diseases of esophagus                       K31.89, Other diseases of stomach and duodenum                       K29.60, Other gastritis without bleeding                       R10.13, Epigastric pain                       R11.2, Nausea with vomiting, unspecified CPT copyright 2016 American [employer] Association. All rights reserved. The codes documented in  this report are preliminary and upon coder review may  be revised to meet current compliance requirements. Christena DeemMartin U Siraj Dermody, MD 08/10/2016 9:58:36 AM This report has been signed electronically. Number of Addenda: 0 Note Initiated On: 08/10/2016 9:15 AM      [facility]

## 2016-08-10 NOTE — Anesthesia Procedure Notes (Signed)
Date/Time: 08/10/2016 9:35 AM Performed by: Henrietta HooverPOPE, Shaqueta Casady Pre-anesthesia Checklist: Patient identified, Emergency Drugs available, Suction available, Patient being monitored and Timeout performed Patient Re-evaluated:Patient Re-evaluated prior to inductionOxygen Delivery Method: Nasal cannula Placement Confirmation: positive ETCO2

## 2016-08-10 NOTE — Anesthesia Postprocedure Evaluation (Signed)
Anesthesia Post Note  Patient: Wesley Stevens  Procedure(s) Performed: Procedure(s) (LRB): ESOPHAGOGASTRODUODENOSCOPY (EGD) WITH PROPOFOL (N/A)  Patient location during evaluation: PACU Anesthesia Type: General Level of consciousness: awake and alert Pain management: pain level controlled Vital Signs Assessment: post-procedure vital signs reviewed and stable Respiratory status: spontaneous breathing, nonlabored ventilation, respiratory function stable and patient connected to nasal cannula oxygen Cardiovascular status: blood pressure returned to baseline and stable Postop Assessment: no signs of nausea or vomiting Anesthetic complications: no     Last Vitals:  Vitals:   08/10/16 0801 08/10/16 1002  BP: 128/86 124/82  Pulse: 79 73  Resp: 17 16  Temp: 36.3 C 36.6 C    Last Pain:  Vitals:   08/10/16 1002  TempSrc: Tympanic                 Yevette EdwardsJames G Jackqulyn Mendel

## 2016-08-10 NOTE — Anesthesia Post-op Follow-up Note (Cosign Needed)
Anesthesia QCDR form completed.        

## 2016-08-10 NOTE — Transfer of Care (Signed)
Immediate Anesthesia Transfer of Care Note  Patient: Wesley Stevens  Procedure(s) Performed: Procedure(s): ESOPHAGOGASTRODUODENOSCOPY (EGD) WITH PROPOFOL (N/A)  Patient Location: PACU  Anesthesia Type:General  Level of Consciousness: awake  Airway & Oxygen Therapy: Patient Spontanous Breathing and Patient connected to nasal cannula oxygen  Post-op Assessment: Report given to RN and Post -op Vital signs reviewed and stable  Post vital signs: Reviewed and stable  Last Vitals:  Vitals:   08/10/16 0801 08/10/16 1002  BP: 128/86 124/82  Pulse: 79 73  Resp: 17 16  Temp: 36.3 C 36.6 C    Last Pain:  Vitals:   08/10/16 1002  TempSrc: Tympanic         Complications: No apparent anesthesia complications

## 2016-08-10 NOTE — Anesthesia Preprocedure Evaluation (Signed)
Anesthesia Evaluation  Patient identified by MRN, date of birth, ID band Patient awake    Reviewed: Allergy & Precautions, H&P , NPO status , Patient's Chart, lab work & pertinent test results, reviewed documented beta blocker date and time   Airway Mallampati: II   Neck ROM: full    Dental  (+) Poor Dentition   Pulmonary neg pulmonary ROS,    Pulmonary exam normal        Cardiovascular hypertension, negative cardio ROS Normal cardiovascular exam Rhythm:regular Rate:Normal     Neuro/Psych PSYCHIATRIC DISORDERS negative neurological ROS  negative psych ROS   GI/Hepatic negative GI ROS, Neg liver ROS, GERD  Medicated,  Endo/Other  negative endocrine ROSdiabetes  Renal/GU Renal diseasenegative Renal ROS  negative genitourinary   Musculoskeletal   Abdominal   Peds  Hematology negative hematology ROS (+)   Anesthesia Other Findings Past Medical History: No date: Allergic rhinitis No date: Anxiety No date: DDD (degenerative disc disease), lumbar No date: Depression No date: Diabetes mellitus without complication (HCC) No date: Elevated transaminase level No date: GERD (gastroesophageal reflux disease) No date: Giardia No date: Hemorrhoids No date: Hypertension     Comment: does not see a cardiologist No date: Kidney stones No date: Lung nodule No date: Renal stones No date: Vitamin B 12 deficiency Past Surgical History: No date: BACK SURGERY No date: COLONOSCOPY No date: ESOPHAGOGASTRODUODENOSCOPY No date: KIDNEY STONE SURGERY 04/13/2012: LUMBAR LAMINECTOMY/DECOMPRESSION MICRODISCECTO*     Comment: Procedure: LUMBAR LAMINECTOMY/DECOMPRESSION               MICRODISCECTOMY 1 LEVEL;  Surgeon: Reinaldo Meekerandy O               Kritzer, MD;  Location: [facility];  Service:              Neurosurgery;  Laterality: Right;  Right Lumbar              Four-Five Microdiskectomy BMI    Body Mass Index:  29.70 kg/m     Reproductive/Obstetrics negative OB ROS                             Anesthesia Physical Anesthesia Plan  ASA: III  Anesthesia Plan: General   Post-op Pain Management:    Induction:   Airway Management Planned:   Additional Equipment:   Intra-op Plan:   Post-operative Plan:   Informed Consent: I have reviewed the patients History and Physical, chart, labs and discussed the procedure including the risks, benefits and alternatives for the proposed anesthesia with the patient or authorized representative who has indicated his/her understanding and acceptance.   Dental Advisory Given  Plan Discussed with: CRNA  Anesthesia Plan Comments:         Anesthesia Quick Evaluation

## 2016-08-11 ENCOUNTER — Encounter: Payer: Self-pay | Admitting: Gastroenterology

## 2016-08-11 LAB — SURGICAL PATHOLOGY

## 2016-08-16 ENCOUNTER — Other Ambulatory Visit
Admission: RE | Admit: 2016-08-16 | Discharge: 2016-08-16 | Disposition: A | Discharge status: Home / Self Care | Payer: BLUE CROSS/BLUE SHIELD | Source: Ambulatory Visit | Attending: Gastroenterology | Admitting: Gastroenterology

## 2016-08-16 DIAGNOSIS — Z8619 Personal history of other infectious and parasitic diseases: Secondary | ICD-10-CM | POA: Insufficient documentation

## 2016-08-20 LAB — GIARDIA, EIA; OVA/PARASITE: Giardia Ag, Stl: NEGATIVE

## 2016-08-20 LAB — O&P RESULT

## 2016-08-26 LAB — GIARDIA, EIA; OVA/PARASITE: GIARDIA AG STL: NEGATIVE

## 2016-08-26 LAB — O&P RESULT

## 2016-08-30 ENCOUNTER — Other Ambulatory Visit
Admission: RE | Admit: 2016-08-30 | Discharge: 2016-08-30 | Disposition: A | Discharge status: Home / Self Care | Payer: BLUE CROSS/BLUE SHIELD | Source: Ambulatory Visit | Attending: Gastroenterology | Admitting: Gastroenterology

## 2016-08-30 DIAGNOSIS — Z8619 Personal history of other infectious and parasitic diseases: Secondary | ICD-10-CM | POA: Insufficient documentation

## 2016-09-08 LAB — O&P RESULT

## 2016-09-08 LAB — GIARDIA, EIA; OVA/PARASITE: Giardia Ag, Stl: NEGATIVE

## 2017-09-19 IMAGING — NM NM HEPATO W/GB/PHARM/[PERSON_NAME]
3 series · 18 of 18 positions shown · non-contrast
Comparison: None

CLINICAL DATA: Dyspepsia, nausea and vomiting after eating, history
diabetes mellitus

EXAM:
NUCLEAR MEDICINE HEPATOBILIARY IMAGING WITH GALLBLADDER EF
TECHNIQUE: Sequential images of the abdomen were obtained [DATE] minutes
following intravenous administration of radiopharmaceutical. After
oral ingestion of Ensure, gallbladder ejection fraction was
determined. At 60 min, normal ejection fraction is greater than 33%.
RADIOPHARMACEUTICALS:  5.41 mCi 6c-NNm  Choletec IV

[Series 1000: gallbladder ef dynamic · 4.80mm/px · 6 of 120 frames shown]
[frame 11/120]
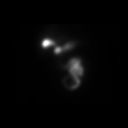
[frame 31/120]
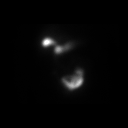
[frame 51/120]
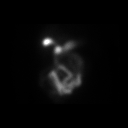
[frame 71/120]
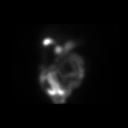
[frame 91/120]
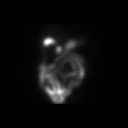
[frame 111/120]
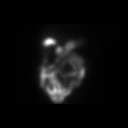

[Series 1000: hepatobiliary dynamic · 9.59mm/px · 6 of 60 frames shown]
[frame 6/60]
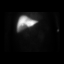
[frame 16/60]
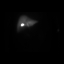
[frame 26/60]
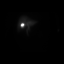
[frame 36/60]
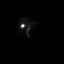
[frame 46/60]
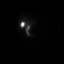
[frame 56/60]
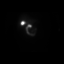

[Series 1000: gallbladder ef dynamic (results) · 4.80mm/px · 6 of 120 frames shown]
[frame 11/120]
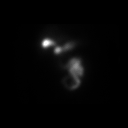
[frame 31/120]
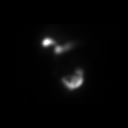
[frame 51/120]
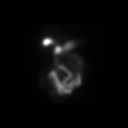
[frame 71/120]
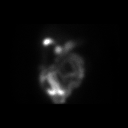
[frame 91/120]
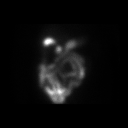
[frame 111/120]
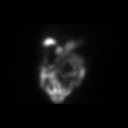

[18 of 18 positions shown; findings below may reference images not displayed]

FINDINGS: Normal tracer extraction from bloodstream indicating normal
hepatocellular function.

Normal excretion of tracer into biliary tree.

Gallbladder visualized at 12 min.

Small bowel visualized at 26 min.

No hepatic retention of tracer.

Subjectively normal emptying of tracer from gallbladder following
fatty meal stimulation.

Calculated gallbladder ejection fraction is 61%, normal.

Patient reported no symptoms following Ensure ingestion.

Normal gallbladder ejection fraction following Ensure ingestion is
greater than 33% at 1 hour.
IMPRESSION: Normal exam.

## 2019-05-08 DIAGNOSIS — G4733 Obstructive sleep apnea (adult) (pediatric): Secondary | ICD-10-CM | POA: Insufficient documentation

## 2019-06-21 DIAGNOSIS — K594 Anal spasm: Secondary | ICD-10-CM | POA: Insufficient documentation

## 2019-06-21 DIAGNOSIS — K642 Third degree hemorrhoids: Secondary | ICD-10-CM | POA: Insufficient documentation

## 2020-05-12 DIAGNOSIS — M791 Myalgia, unspecified site: Secondary | ICD-10-CM | POA: Insufficient documentation

## 2020-05-12 DIAGNOSIS — T466X5A Adverse effect of antihyperlipidemic and antiarteriosclerotic drugs, initial encounter: Secondary | ICD-10-CM | POA: Insufficient documentation

## 2021-01-19 ENCOUNTER — Other Ambulatory Visit: Payer: Self-pay

## 2021-01-19 ENCOUNTER — Other Ambulatory Visit: Payer: Self-pay | Admitting: Podiatry

## 2021-01-19 ENCOUNTER — Encounter: Payer: Self-pay | Admitting: Podiatry

## 2021-01-19 ENCOUNTER — Ambulatory Visit (INDEPENDENT_AMBULATORY_CARE_PROVIDER_SITE_OTHER): Payer: BC Managed Care – PPO

## 2021-01-19 ENCOUNTER — Ambulatory Visit: Payer: BC Managed Care – PPO | Admitting: Podiatry

## 2021-01-19 DIAGNOSIS — R7989 Other specified abnormal findings of blood chemistry: Secondary | ICD-10-CM | POA: Insufficient documentation

## 2021-01-19 DIAGNOSIS — M722 Plantar fascial fibromatosis: Secondary | ICD-10-CM

## 2021-01-19 DIAGNOSIS — I10 Essential (primary) hypertension: Secondary | ICD-10-CM | POA: Insufficient documentation

## 2021-01-19 DIAGNOSIS — M5136 Other intervertebral disc degeneration, lumbar region: Secondary | ICD-10-CM | POA: Insufficient documentation

## 2021-01-19 DIAGNOSIS — M729 Fibroblastic disorder, unspecified: Secondary | ICD-10-CM

## 2021-01-19 DIAGNOSIS — K219 Gastro-esophageal reflux disease without esophagitis: Secondary | ICD-10-CM | POA: Insufficient documentation

## 2021-01-19 MED ORDER — MELOXICAM 15 MG PO TABS: 15.0000 mg | ORAL_TABLET | Freq: Every day | ORAL | 3 refills | Status: AC

## 2021-01-19 MED ORDER — TRIAMCINOLONE ACETONIDE 40 MG/ML IJ SUSP
40.0000 mg | Freq: Once | INTRAMUSCULAR | Status: AC
  Administered 2021-01-19: 40 mg

## 2021-01-19 MED ORDER — METHYLPREDNISOLONE 4 MG PO TBPK: ORAL_TABLET | ORAL | 0 refills | Status: AC

## 2021-01-19 NOTE — Patient Instructions (Signed)

## 2021-01-19 NOTE — Progress Notes (Signed)
Subjective:  Patient ID: Wesley Stevens, male    DOB: 11-30-1960,  MRN: [id number] HPI Chief Complaint  Patient presents with   Foot Pain    Plantar heel/arch/lateral side bilateral (L>R) - aching x 2-3 months, AM pain, wears orthotics, history of PF, tried ice, stretching and Ibuprofen   New Patient (Initial Visit)    60 y.o. male presents with the above complaint.   ROS: Denies fever chills nausea vomit muscle aches pains calf pain back pain chest pain shortness of breath.  Past Medical History:  Diagnosis Date   Allergic rhinitis    Anxiety    DDD (degenerative disc disease), lumbar    Depression    Diabetes mellitus without complication (HCC)    Elevated transaminase level    GERD (gastroesophageal reflux disease)    Giardia    Hemorrhoids    Hypertension    does not see a cardiologist   Kidney stones    Lung nodule    Renal stones    Vitamin B 12 deficiency    Past Surgical History:  Procedure Laterality Date   BACK SURGERY     COLONOSCOPY     ESOPHAGOGASTRODUODENOSCOPY     ESOPHAGOGASTRODUODENOSCOPY (EGD) WITH PROPOFOL N/A 08/10/2016   Procedure: ESOPHAGOGASTRODUODENOSCOPY (EGD) WITH PROPOFOL;  Surgeon: Christena Deem, MD;  Location: [facility];  Service: Endoscopy;  Laterality: N/A;   KIDNEY STONE SURGERY     LUMBAR LAMINECTOMY/DECOMPRESSION MICRODISCECTOMY  04/13/2012   Procedure: LUMBAR LAMINECTOMY/DECOMPRESSION MICRODISCECTOMY 1 LEVEL;  Surgeon: Reinaldo Meeker, MD;  Location: [facility];  Service: Neurosurgery;  Laterality: Right;  Right Lumbar Four-Five Microdiskectomy    Current Outpatient Medications:    meloxicam (MOBIC) 15 MG tablet, Take 1 tablet (15 mg total) by mouth daily., Disp: 30 tablet, Rfl: 3   methylPREDNISolone (MEDROL DOSEPAK) 4 MG TBPK tablet, 6 day dose pack - take as directed, Disp: 21 tablet, Rfl: 0   amLODipine (NORVASC) 10 MG tablet, Take 10 mg by mouth daily., Disp: , Rfl:    CONTOUR NEXT TEST test strip, 1 each 2 (two)  times daily., Disp: , Rfl:    Cyanocobalamin (VITAMIN B 12 PO), Take by mouth., Disp: , Rfl:    empagliflozin (JARDIANCE) 25 MG TABS tablet, Take 25 mg by mouth daily., Disp: , Rfl:    fexofenadine (ALLEGRA) 180 MG tablet, Take 180 mg by mouth daily., Disp: , Rfl:    glimepiride (AMARYL) 4 MG tablet, Take 4 mg by mouth daily before breakfast., Disp: , Rfl:    ibuprofen (ADVIL,MOTRIN) 200 MG tablet, Take 800 mg by mouth every 8 (eight) hours as needed. Pain., Disp: , Rfl:    metFORMIN (GLUCOPHAGE) 1000 MG tablet, Take 1,000 mg by mouth 2 (two) times daily with a meal., Disp: , Rfl:    metoprolol succinate (TOPROL-XL) 50 MG 24 hr tablet, Take 50 mg by mouth daily. Take with or immediately following a meal., Disp: , Rfl:    oxyCODONE (ROXICODONE) 15 MG immediate release tablet, Take 15 mg by mouth every 4 (four) hours as needed. pain, Disp: , Rfl:    pantoprazole (PROTONIX) 40 MG tablet, Take 40 mg by mouth daily., Disp: , Rfl:    phentermine (ADIPEX-P) 37.5 MG tablet, Take 37.5 mg by mouth daily., Disp: , Rfl:    RELION PEN NEEDLE 31G/8MM 31G X 8 MM MISC, USE 1 ONCE DAILY, Disp: , Rfl:    tadalafil (CIALIS) 5 MG tablet, SMARTSIG:2-4 Tablet(s) By Mouth Daily PRN, Disp: , Rfl:  testosterone cypionate (DEPOTESTOSTERONE CYPIONATE) 200 MG/ML injection, Inject 200 mg into the muscle every 14 (fourteen) days., Disp: , Rfl:    TRESIBA FLEXTOUCH 100 UNIT/ML FlexTouch Pen, SMARTSIG:42 Unit(s) SUB-Q Daily, Disp: , Rfl:   Allergies  Allergen Reactions   Ace Inhibitors    Hydrocodone    Lovastatin Other (See Comments)    Severe muscle cramps.   Oxycodone Itching and Rash   Review of Systems Objective:  There were no vitals filed for this visit.  General: Well developed, nourished, in no acute distress, alert and oriented x3   Dermatological: Skin is warm, dry and supple bilateral. Nails x 10 are well maintained; remaining integument appears unremarkable at this time. There are no open sores, no  preulcerative lesions, no rash or signs of infection present.  Vascular: Dorsalis Pedis artery and Posterior Tibial artery pedal pulses are 2/4 bilateral with immedate capillary fill time. Pedal hair growth present. No varicosities and no lower extremity edema present bilateral.   Neruologic: Grossly intact via light touch bilateral. Vibratory intact via tuning fork bilateral. Protective threshold with Semmes Wienstein monofilament intact to all pedal sites bilateral. Patellar and Achilles deep tendon reflexes 2+ bilateral. No Babinski or clonus noted bilateral.   Musculoskeletal: No gross boney pedal deformities bilateral. No pain, crepitus, or limitation noted with foot and ankle range of motion bilateral. Muscular strength 5/5 in all groups tested bilateral.  Pain on palpation MucoClear tubercles bilateral  Gait: Unassisted, Nonantalgic.    Radiographs:  Radiographs taken today demonstrate an osseously mature individual soft tissue increase in density plantar fascial kidney insertion site posterior plantar calcaneal heel spur thickening of the plantar fascia at the calcaneal insertion site.  Assessment & Plan:   Assessment: Planter fasciitis bilateral  Plan: Discussed etiology pathology conservative surgical therapies.  Injected bilateral heels today start him on a Medrol Dosepak to be followed by meloxicam.  Placed in plantar fascial braces discussed appropriate shoe gear stretching exercise ice therapy sugar modifications.  He will watch his blood sugar carefully and adjust his units accordingly.    Tephanie Escorcia T. [place], [place]

## 2021-03-02 ENCOUNTER — Ambulatory Visit: Payer: BC Managed Care – PPO | Admitting: Podiatry

## 2021-04-08 ENCOUNTER — Ambulatory Visit: Payer: BC Managed Care – PPO | Admitting: Podiatry

## 2022-05-27 ENCOUNTER — Other Ambulatory Visit: Payer: Self-pay | Admitting: Internal Medicine

## 2022-05-27 DIAGNOSIS — E785 Hyperlipidemia, unspecified: Secondary | ICD-10-CM

## 2022-05-27 DIAGNOSIS — Z0000 Encounter for general adult medical examination without abnormal findings: Secondary | ICD-10-CM

## 2022-05-27 DIAGNOSIS — R6889 Other general symptoms and signs: Secondary | ICD-10-CM

## 2022-05-27 DIAGNOSIS — E1165 Type 2 diabetes mellitus with hyperglycemia: Secondary | ICD-10-CM

## 2022-06-04 ENCOUNTER — Ambulatory Visit
Admission: RE | Admit: 2022-06-04 | Discharge: 2022-06-04 | Disposition: A | Discharge status: Home / Self Care | Payer: Self-pay | Source: Ambulatory Visit | Attending: Internal Medicine | Admitting: Internal Medicine

## 2022-06-04 DIAGNOSIS — Z0000 Encounter for general adult medical examination without abnormal findings: Secondary | ICD-10-CM | POA: Insufficient documentation

## 2022-06-04 DIAGNOSIS — R6889 Other general symptoms and signs: Secondary | ICD-10-CM | POA: Insufficient documentation

## 2022-06-04 DIAGNOSIS — E1165 Type 2 diabetes mellitus with hyperglycemia: Secondary | ICD-10-CM | POA: Insufficient documentation

## 2022-06-04 DIAGNOSIS — E785 Hyperlipidemia, unspecified: Secondary | ICD-10-CM | POA: Insufficient documentation

## 2023-08-30 DIAGNOSIS — E291 Testicular hypofunction: Secondary | ICD-10-CM | POA: Diagnosis not present

## 2023-08-30 DIAGNOSIS — R7989 Other specified abnormal findings of blood chemistry: Secondary | ICD-10-CM | POA: Diagnosis not present

## 2023-08-31 DIAGNOSIS — E291 Testicular hypofunction: Secondary | ICD-10-CM | POA: Diagnosis not present

## 2023-08-31 DIAGNOSIS — R7989 Other specified abnormal findings of blood chemistry: Secondary | ICD-10-CM | POA: Diagnosis not present

## 2023-09-13 DIAGNOSIS — R7989 Other specified abnormal findings of blood chemistry: Secondary | ICD-10-CM | POA: Diagnosis not present

## 2023-10-03 DIAGNOSIS — E538 Deficiency of other specified B group vitamins: Secondary | ICD-10-CM | POA: Diagnosis not present

## 2023-10-03 DIAGNOSIS — E349 Endocrine disorder, unspecified: Secondary | ICD-10-CM | POA: Diagnosis not present

## 2023-10-05 DIAGNOSIS — G4733 Obstructive sleep apnea (adult) (pediatric): Secondary | ICD-10-CM | POA: Diagnosis not present

## 2023-10-12 DIAGNOSIS — R7989 Other specified abnormal findings of blood chemistry: Secondary | ICD-10-CM | POA: Diagnosis not present

## 2023-10-20 DIAGNOSIS — E349 Endocrine disorder, unspecified: Secondary | ICD-10-CM | POA: Diagnosis not present

## 2023-10-21 DIAGNOSIS — M1712 Unilateral primary osteoarthritis, left knee: Secondary | ICD-10-CM | POA: Diagnosis not present

## 2023-11-03 DIAGNOSIS — E538 Deficiency of other specified B group vitamins: Secondary | ICD-10-CM | POA: Diagnosis not present

## 2023-11-09 DIAGNOSIS — R197 Diarrhea, unspecified: Secondary | ICD-10-CM | POA: Diagnosis not present

## 2023-11-09 DIAGNOSIS — K219 Gastro-esophageal reflux disease without esophagitis: Secondary | ICD-10-CM | POA: Diagnosis not present

## 2023-11-09 DIAGNOSIS — E538 Deficiency of other specified B group vitamins: Secondary | ICD-10-CM | POA: Diagnosis not present

## 2023-11-09 DIAGNOSIS — R1084 Generalized abdominal pain: Secondary | ICD-10-CM | POA: Diagnosis not present

## 2023-11-09 DIAGNOSIS — E1165 Type 2 diabetes mellitus with hyperglycemia: Secondary | ICD-10-CM | POA: Diagnosis not present

## 2023-11-11 DIAGNOSIS — R197 Diarrhea, unspecified: Secondary | ICD-10-CM | POA: Diagnosis not present

## 2023-11-17 DIAGNOSIS — E349 Endocrine disorder, unspecified: Secondary | ICD-10-CM | POA: Diagnosis not present

## 2023-11-17 DIAGNOSIS — R7989 Other specified abnormal findings of blood chemistry: Secondary | ICD-10-CM | POA: Diagnosis not present

## 2023-12-06 DIAGNOSIS — E349 Endocrine disorder, unspecified: Secondary | ICD-10-CM | POA: Diagnosis not present

## 2023-12-06 DIAGNOSIS — E538 Deficiency of other specified B group vitamins: Secondary | ICD-10-CM | POA: Diagnosis not present

## 2023-12-15 DIAGNOSIS — E785 Hyperlipidemia, unspecified: Secondary | ICD-10-CM | POA: Diagnosis not present

## 2023-12-15 DIAGNOSIS — E1165 Type 2 diabetes mellitus with hyperglycemia: Secondary | ICD-10-CM | POA: Diagnosis not present

## 2023-12-15 DIAGNOSIS — R7989 Other specified abnormal findings of blood chemistry: Secondary | ICD-10-CM | POA: Diagnosis not present

## 2023-12-15 DIAGNOSIS — E611 Iron deficiency: Secondary | ICD-10-CM | POA: Diagnosis not present

## 2023-12-15 DIAGNOSIS — E538 Deficiency of other specified B group vitamins: Secondary | ICD-10-CM | POA: Diagnosis not present

## 2023-12-23 DIAGNOSIS — Z125 Encounter for screening for malignant neoplasm of prostate: Secondary | ICD-10-CM | POA: Diagnosis not present

## 2023-12-23 DIAGNOSIS — I10 Essential (primary) hypertension: Secondary | ICD-10-CM | POA: Diagnosis not present

## 2023-12-23 DIAGNOSIS — M51362 Other intervertebral disc degeneration, lumbar region with discogenic back pain and lower extremity pain: Secondary | ICD-10-CM | POA: Diagnosis not present

## 2023-12-23 DIAGNOSIS — R7989 Other specified abnormal findings of blood chemistry: Secondary | ICD-10-CM | POA: Diagnosis not present

## 2023-12-23 DIAGNOSIS — G4733 Obstructive sleep apnea (adult) (pediatric): Secondary | ICD-10-CM | POA: Diagnosis not present

## 2023-12-23 DIAGNOSIS — E1165 Type 2 diabetes mellitus with hyperglycemia: Secondary | ICD-10-CM | POA: Diagnosis not present

## 2023-12-23 DIAGNOSIS — K219 Gastro-esophageal reflux disease without esophagitis: Secondary | ICD-10-CM | POA: Diagnosis not present

## 2023-12-23 DIAGNOSIS — E538 Deficiency of other specified B group vitamins: Secondary | ICD-10-CM | POA: Diagnosis not present

## 2023-12-23 DIAGNOSIS — M791 Myalgia, unspecified site: Secondary | ICD-10-CM | POA: Diagnosis not present

## 2023-12-23 DIAGNOSIS — Z23 Encounter for immunization: Secondary | ICD-10-CM | POA: Diagnosis not present

## 2023-12-23 DIAGNOSIS — I2510 Atherosclerotic heart disease of native coronary artery without angina pectoris: Secondary | ICD-10-CM | POA: Diagnosis not present

## 2023-12-23 DIAGNOSIS — E611 Iron deficiency: Secondary | ICD-10-CM | POA: Diagnosis not present

## 2024-01-06 DIAGNOSIS — E538 Deficiency of other specified B group vitamins: Secondary | ICD-10-CM | POA: Diagnosis not present

## 2024-01-06 DIAGNOSIS — E349 Endocrine disorder, unspecified: Secondary | ICD-10-CM | POA: Diagnosis not present

## 2024-01-09 DIAGNOSIS — M1712 Unilateral primary osteoarthritis, left knee: Secondary | ICD-10-CM | POA: Diagnosis not present

## 2024-01-20 DIAGNOSIS — E349 Endocrine disorder, unspecified: Secondary | ICD-10-CM | POA: Diagnosis not present

## 2024-01-20 DIAGNOSIS — R7989 Other specified abnormal findings of blood chemistry: Secondary | ICD-10-CM | POA: Diagnosis not present

## 2024-01-26 DIAGNOSIS — I2510 Atherosclerotic heart disease of native coronary artery without angina pectoris: Secondary | ICD-10-CM | POA: Diagnosis not present

## 2024-01-30 DIAGNOSIS — M25562 Pain in left knee: Secondary | ICD-10-CM | POA: Diagnosis not present

## 2024-01-30 DIAGNOSIS — M1712 Unilateral primary osteoarthritis, left knee: Secondary | ICD-10-CM | POA: Diagnosis not present

## 2024-02-02 DIAGNOSIS — E119 Type 2 diabetes mellitus without complications: Secondary | ICD-10-CM | POA: Diagnosis not present

## 2024-02-02 DIAGNOSIS — I10 Essential (primary) hypertension: Secondary | ICD-10-CM | POA: Diagnosis not present

## 2024-02-02 DIAGNOSIS — R931 Abnormal findings on diagnostic imaging of heart and coronary circulation: Secondary | ICD-10-CM | POA: Diagnosis not present

## 2024-02-02 DIAGNOSIS — Z7982 Long term (current) use of aspirin: Secondary | ICD-10-CM | POA: Diagnosis not present

## 2024-02-02 DIAGNOSIS — E785 Hyperlipidemia, unspecified: Secondary | ICD-10-CM | POA: Diagnosis not present

## 2024-02-02 DIAGNOSIS — Z789 Other specified health status: Secondary | ICD-10-CM | POA: Diagnosis not present

## 2024-02-02 DIAGNOSIS — R9430 Abnormal result of cardiovascular function study, unspecified: Secondary | ICD-10-CM | POA: Diagnosis not present

## 2024-02-02 DIAGNOSIS — R9439 Abnormal result of other cardiovascular function study: Secondary | ICD-10-CM | POA: Diagnosis not present

## 2024-02-03 ENCOUNTER — Other Ambulatory Visit: Payer: Self-pay | Admitting: Cardiovascular Disease

## 2024-02-03 DIAGNOSIS — R9430 Abnormal result of cardiovascular function study, unspecified: Secondary | ICD-10-CM

## 2024-02-03 DIAGNOSIS — R9439 Abnormal result of other cardiovascular function study: Secondary | ICD-10-CM

## 2024-02-06 DIAGNOSIS — M25562 Pain in left knee: Secondary | ICD-10-CM | POA: Diagnosis not present

## 2024-02-07 DIAGNOSIS — E349 Endocrine disorder, unspecified: Secondary | ICD-10-CM | POA: Diagnosis not present

## 2024-02-07 DIAGNOSIS — E538 Deficiency of other specified B group vitamins: Secondary | ICD-10-CM | POA: Diagnosis not present

## 2024-02-13 DIAGNOSIS — R931 Abnormal findings on diagnostic imaging of heart and coronary circulation: Secondary | ICD-10-CM | POA: Diagnosis not present

## 2024-02-13 DIAGNOSIS — M7052 Other bursitis of knee, left knee: Secondary | ICD-10-CM | POA: Diagnosis not present

## 2024-02-13 DIAGNOSIS — R9439 Abnormal result of other cardiovascular function study: Secondary | ICD-10-CM | POA: Diagnosis not present

## 2024-02-21 ENCOUNTER — Encounter (HOSPITAL_COMMUNITY): Payer: Self-pay

## 2024-02-21 DIAGNOSIS — E349 Endocrine disorder, unspecified: Secondary | ICD-10-CM | POA: Diagnosis not present

## 2024-02-24 ENCOUNTER — Telehealth (HOSPITAL_COMMUNITY): Payer: Self-pay | Admitting: *Deleted

## 2024-02-24 NOTE — Telephone Encounter (Signed)
 Reaching out to patient to offer assistance regarding upcoming cardiac imaging study; pt verbalizes understanding of appt date/time, parking situation and where to check in, pre-test NPO status and medications ordered, and verified current allergies; name and call back number provided for further questions should they arise  Chantal Requena RN Navigator Cardiac Imaging [facility] [phone] office [phone] cell  Patient aware to avoid Cialis 72 hours prior to his CT scan. He is to take 50mg  metoprolol  tartrate two hours prior to his appt.

## 2024-02-27 ENCOUNTER — Ambulatory Visit
Admission: RE | Admit: 2024-02-27 | Discharge: 2024-02-27 | Disposition: A | Source: Ambulatory Visit | Attending: Cardiovascular Disease | Admitting: Cardiovascular Disease

## 2024-02-27 DIAGNOSIS — R9430 Abnormal result of cardiovascular function study, unspecified: Secondary | ICD-10-CM | POA: Insufficient documentation

## 2024-02-27 DIAGNOSIS — R9439 Abnormal result of other cardiovascular function study: Secondary | ICD-10-CM | POA: Diagnosis not present

## 2024-02-27 MED ORDER — IOHEXOL 350 MG/ML SOLN
100.0000 mL | Freq: Once | INTRAVENOUS | Status: AC | PRN
  Administered 2024-02-27: 100 mL via INTRAVENOUS

## 2024-02-27 MED ORDER — METOPROLOL TARTRATE 5 MG/5ML IV SOLN: 10.0000 mg | Freq: Once | INTRAVENOUS | Status: DC | PRN

## 2024-02-27 MED ORDER — NITROGLYCERIN 0.4 MG SL SUBL
0.8000 mg | SUBLINGUAL_TABLET | Freq: Once | SUBLINGUAL | Status: AC
  Administered 2024-02-27: 0.8 mg via SUBLINGUAL
  Filled 2024-02-27: qty 25

## 2024-02-27 MED ORDER — DILTIAZEM HCL 25 MG/5ML IV SOLN: 10.0000 mg | INTRAVENOUS | Status: DC | PRN
